# Patient Record
Sex: Male | Born: 1975 | Hispanic: Yes
Health system: Western US, Academic
[De-identification: ages and names within clinical notes are randomized; demographics above are authoritative.]

---

## 2020-03-24 ENCOUNTER — Ambulatory Visit: Payer: Medicaid Other

## 2020-03-24 DIAGNOSIS — R69 Illness, unspecified: Secondary | ICD-10-CM

## 2020-05-09 ENCOUNTER — Ambulatory Visit: Payer: Medicaid Other

## 2020-05-09 DIAGNOSIS — R69 Illness, unspecified: Secondary | ICD-10-CM

## 2020-06-19 ENCOUNTER — Telehealth (HOSPITAL_BASED_OUTPATIENT_CLINIC_OR_DEPARTMENT_OTHER): Payer: Self-pay

## 2020-06-19 NOTE — Telephone Encounter (Signed)
Processed first kidney transplant referral. Plan to forward chart to RN for initial review.

## 2020-06-21 ENCOUNTER — Telehealth (HOSPITAL_BASED_OUTPATIENT_CLINIC_OR_DEPARTMENT_OTHER): Payer: Self-pay

## 2020-06-21 NOTE — Telephone Encounter (Signed)
On Dialysis .Medical records and images requested     Bryan Grant to work on appointment and testing coordination:  Neph appt     [place] to mail packet

## 2020-06-22 ENCOUNTER — Other Ambulatory Visit (HOSPITAL_BASED_OUTPATIENT_CLINIC_OR_DEPARTMENT_OTHER): Payer: Self-pay | Admitting: Internal Medicine

## 2020-06-22 DIAGNOSIS — Z01818 Encounter for other preprocedural examination: Secondary | ICD-10-CM

## 2020-06-22 NOTE — Telephone Encounter (Signed)
Sent FC req to [facility] for [place].

## 2020-06-26 ENCOUNTER — Encounter (HOSPITAL_BASED_OUTPATIENT_CLINIC_OR_DEPARTMENT_OTHER): Payer: Self-pay

## 2020-07-13 ENCOUNTER — Telehealth (HOSPITAL_BASED_OUTPATIENT_CLINIC_OR_DEPARTMENT_OTHER): Payer: Self-pay

## 2020-07-13 NOTE — Telephone Encounter (Signed)
TC with pt and scheduled new pt on 09/08/20. Appt notification letter mailed and emailed to pt. Notified to bring a copy of covid vx card for his first visit. Pt stated he understood and will bring it.

## 2020-07-29 ENCOUNTER — Telehealth (HOSPITAL_BASED_OUTPATIENT_CLINIC_OR_DEPARTMENT_OTHER): Payer: Self-pay

## 2020-07-29 NOTE — Telephone Encounter (Addendum)
CT Abd/Pelvis 05/09/20, CT Chest 03/24/20 [place] are in PACS.

## 2020-08-20 ENCOUNTER — Other Ambulatory Visit (HOSPITAL_COMMUNITY): Payer: Self-pay

## 2020-08-23 ENCOUNTER — Other Ambulatory Visit (HOSPITAL_COMMUNITY): Payer: Self-pay

## 2020-08-23 DIAGNOSIS — R69 Illness, unspecified: Secondary | ICD-10-CM

## 2020-08-24 ENCOUNTER — Encounter (HOSPITAL_BASED_OUTPATIENT_CLINIC_OR_DEPARTMENT_OTHER): Payer: Self-pay

## 2020-09-04 ENCOUNTER — Telehealth (HOSPITAL_BASED_OUTPATIENT_CLINIC_OR_DEPARTMENT_OTHER): Payer: Self-pay

## 2020-09-04 NOTE — Telephone Encounter (Signed)
Left a VM reminder regarding the appointment on Tuesday 09/08/2020.

## 2020-09-08 ENCOUNTER — Encounter (HOSPITAL_BASED_OUTPATIENT_CLINIC_OR_DEPARTMENT_OTHER): Payer: Self-pay

## 2020-09-08 ENCOUNTER — Ambulatory Visit: Payer: No Typology Code available for payment source | Attending: Nephrology | Admitting: Nephrology

## 2020-09-08 ENCOUNTER — Encounter (HOSPITAL_BASED_OUTPATIENT_CLINIC_OR_DEPARTMENT_OTHER): Payer: Self-pay | Admitting: Nurse Practitioner

## 2020-09-08 VITALS — BP 141/87 | HR 87 | Temp 97.2°F | Ht 69.76 in | Wt 181.0 lb

## 2020-09-08 DIAGNOSIS — E118 Type 2 diabetes mellitus with unspecified complications: Secondary | ICD-10-CM | POA: Insufficient documentation

## 2020-09-08 DIAGNOSIS — N186 End stage renal disease: Secondary | ICD-10-CM | POA: Insufficient documentation

## 2020-09-08 DIAGNOSIS — J90 Pleural effusion, not elsewhere classified: Secondary | ICD-10-CM | POA: Insufficient documentation

## 2020-09-08 DIAGNOSIS — T63311D Toxic effect of venom of black widow spider, accidental (unintentional), subsequent encounter: Secondary | ICD-10-CM | POA: Insufficient documentation

## 2020-09-08 DIAGNOSIS — Z992 Dependence on renal dialysis: Secondary | ICD-10-CM | POA: Insufficient documentation

## 2020-09-08 DIAGNOSIS — Z01818 Encounter for other preprocedural examination: Secondary | ICD-10-CM | POA: Insufficient documentation

## 2020-09-08 DIAGNOSIS — I10 Essential (primary) hypertension: Secondary | ICD-10-CM | POA: Insufficient documentation

## 2020-09-08 NOTE — Progress Notes (Signed)
Transplant Nephrology Outpatient Consult - Pretransplant Evaluation Note  Encounter Date: 09/08/2020  ACOMMPANIED BY: This patient is accompanied in the office by his girlfriend.    Dear Dr. Ludwig Clarks,   It was a pleasure to see your patient, Bryan Grant, in the Transplantation Clinic at [place] for evaluation as a potential kidney transplant recipient.      Chief Concern:     Bryan Grant presents to the [facility] for a visit regarding his candidacy for kidney transplantation.      History of Present Illness:    Bryan Grant is a 44 year old man with a history of ESRD in the setting of DM II and history of black widow spider bite.     He recently moved to [place] state in May 2021 from [place]. He reports he is currently listed at [facility] in [place].      He reports being diagnosed with DM in 2005 but otherwise describes himself as being overall healthy working as an Nutritional therapist until 2015.     In 2015, he was bit by a black widow spider. 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 He underwent a few rounds of dialysis while hospitalized but was able to come off of renal replacement. He ultimately started chronic HD in March 2017. He never underwent renal biopsy that he is aware of.     In 2020 he underwent cholecystectomy. Since this surgery he reports an increased gag reflex and seems to carry more of his edema in his stomach rather than his legs. Around the time of his cholecystectomy he also recalls developing a pleural effusion due to unknown etiology.    Since moving to [place] in May 2021 he again developed a pleural effusion and underwent thoracentesis. I do not have records of this at this time although what I can find appears to be related to fluid overload and not infection.     He has established care  with a PCP and nephrology since moving to [place]. He is awaiting appointments with cardiology and dermatology.     Renal history:   Renal biopsy: No  Dialysis start date & Modality: March 2017; HD   Dialysis Regimen: MWF  Problems on dialysis: No, but reports fatigue after HD  Target Weight: 80.1 kg - usually take off ~3 kg  Produces about 2oz of urine/24hrs.  Uremic symptoms: Yes, pruritis   Proteinuria: Unknown  Hematuria: Unknown, no gross hematuria  Kidney stones: No  Recurrent urinary tract infections: No  Urological abnormalities: No  Prostate issues: No    Diabetes: Type 2 diagnosed in 2005 with the following complications:    Retinopathy: Yes, history of injections- now resolved; h/o cataract removal and implants  Neuropathy: Yes, taking cymbalta  Gastropathy: Unknown  Autonomic dysfunction: Sometimes feels cold   Hypoglycemic unawareness: No, fatigue  Hypoglycemic syncopal episodes: No  Nonhealing lower extremity ulcers: No  Amputations: No  Diabetes control: Diet, has been on insulin in the past  Glucose monitoring: not checking  HgbA1c: unknown     Cardiovascular disease:   Coronary artery disease: unknown, history of MI  Congestive Heart Failure: No  Arrhythmia: Yes, history of irregularity with pleural effusion. Reports that this resolved with thoracentesis.   Peripheral vascular disease: No  Cerebrovascular disease: No    Mr. Okazaki endorses ~70lbs  weight in the last year (related to not having a car and walking more per pt report) and good appetite; he follows a renal diet. For exercise he walks and reports that he can walk 10 miles and go up 7 flights of stairs without stopping. Exertion is limited by leg weakness. Denies chest pain or dyspnea with exertion. Reported blood pressures are typically 120s/70s.    Past Medical History  1) ESRD on HD  2) DM II  3) H/o black widow bite (c/b MI and CKD)  4) Cdiff  5) HTN    He reports a history of:   Cancers: No   Skin cancers: No   Seizures: No   Lung  disease: No   Tuberculosis or positive PPD: No   Peptic ulcer disease: No   Hepatitis: No   Gallstones OR Cholecystitis: Yes, see HPI   Prostate problems: No  Parasitic infections: No  Blood transfusions: No  Mental Illness: No  Bleeding Disorders: No    I personally reviewed and confirmed the ROS, past medical history, past surgical history, family history and social history in the record with the patient today.     Review of Systems -   He denies fevers, chills, cough, chest pain, palpitations, shortness of breath, nausea, headache, claudication, edema, bleeding, rashes, joint pain, heartburn, sudden weakness, depression, or anxiety.    Past Surgical History  Fistula creation 2017  Cholecystectomy 2020  Cataract  Astigmatism correction  Cyst removal from back    Trouble with anesthesia: No    Social History  He denies smoking, marijuana, alcohol or illicit drug use. Never smoker. Occasionally has an ETOH drink on New Years Eve  Caregiver: Girlfriend  Married: No  Children: 4  Occupation: Working on Nature conservation officer here  Lives: [place], [place]: No  Travel: No    Family History  Cancer: Paternal uncle died of stomach cancer  Cardiac Disease: Maternal side of family  DM: Mother and fathers side  Renal Disease: No    Mother: No known health issues  Father: Deceased- alzheimers  Siblings: 1 sister; no known health issues    Current Outpatient Medications   Medication Sig Dispense Refill   . 5-Hydroxytryptophan (5-HTP OR) Take by mouth.     Marland Kitchen amLODIPine 10 MG tablet Take 10 mg by mouth daily.     Marland Kitchen atorvastatin 20 MG tablet Take by mouth daily.     . DULoxetine 60 MG DR capsule Take by mouth.     . Glucosamine-Chondroit-Vit C-Mn (GLUCOSAMINE 1500 COMPLEX OR) Take by mouth.     Marland Kitchen KRILL OIL OR Take by mouth.     . lactobacillus rhamnosus GG (Culturelle) capsule Take 1 capsule by mouth.     . Multiple Vitamin (MULTIVITAMIN OR) Take by mouth.     . Nutritional Supplements (DHEA OR) Take by mouth.     . Other Meds (See  Sig/Instructions) daily. Medication name: Ageless Male Supplement     . sevelamer carbonate 800 MG tablet Take 3,200 mg by mouth 3 times a day with meals. 4 with meals and 3 with snacks     . Vitamin D (Cholecalciferol) 50 MCG (2000 UT) capsule Take 6,000 Units by mouth daily.       No current facility-administered medications for this visit.      Medication Notes:   I have reviewed the above list of medicines with the patient and it is accurate.    Review of patient's allergies indicates:  Allergies  Allergen Reactions   . Glipizide Other     Lethargic, Severe Hot and Cold in Extremities.   Celesta Gentile [Sitagliptin] Swelling   . Metformin Other     Lethargic, Severe Hot and Cold in Extremities    . Midodrine Other     Goose Bumps, Feeling Dizzy, Tachycardia, Severe pain when touch       BP (!) 141/87 Comment: post 5 mins  Pulse 87   Temp 36.2 C   Ht 5' 9.76" (1.772 m)   Wt 82.1 kg (181 lb)   SpO2 100% Comment: RA  BMI 26.15 kg/m   Estimated body mass index is 26.15 kg/m as calculated from the following:    Height as of this encounter: 5' 9.76" (1.772 m).    Weight as of this encounter: 82.1 kg (181 lb).     Physical Exam:   Vitals: As above  General: 44 year old man in no acute distress.  HEENT: Moist mucosa without erythema or exudate. Dentition without infection noted.  Neck: Supple, without lymphadenopathy or carotid bruits. No palpable thyroid nodules.   Lungs: Clear to auscultation, without wheeze or rales.  Cardiovascular: Regular rate and rhythm, without murmur, gallop or rub.   Abdominal: Soft, nontender, and nondistended. No hepatosplenomegaly noted.   Extremities: No edema bilaterally. Femoral pulses are 2+. Radial pulses are 2+ and pedal pulses are 2+. There is a upper extremity AV fistula that is intact with a normal bruit and thrill.   Neurologic: Grossly normal. Alert & oriented.  Musculoskeletal: Transfers to and from the exam table with ease.  Skin: Normal without rash. No lower extremity  ulceration.     RESULTS REVIEWED.  Chart reviewed.    Cardiovascular Studies:  Echocardiogram: 03/27/20  There is mild to moderate tricuspid valve regurgitation.   There is mild pulmonic valve regurgitation.   Both atria are severely dilated in size.   The right ventricle is moderately dilated in size with reduced RV systolic   function.   The left ventricle is normal in size with low normal LV systolic function.   There is moderate concentric left ventricular hypertrophy.   There is grade II (moderate) left ventricular diastolic dysfunction.   There are no regional wall motion abnormalities.   Left ventricular ejection fraction is calculated at 52.4%.    Chest X-ray: 05/09/20  There is interval development of a large right pleural effusion, with underlying airspace disease that could represent atelectasis or infiltrate.  There is cardiomegaly.  There is no pneumothorax.    Chest CT: 03/24/20  1. Large right low-density layering pleural effusion with associated compressive atelectasis of the right lung including near complete collapse of the right lower lobe.   2. Additional findings suggestive of volume overload including at least mild ascites as well as diffuse body wall edema.  3. Mild cardiomegaly.    Electrocardiogram: 05/10/20  Sinus rhythm with 1st degree A-V block  Left axis deviation  Left ventricular hypertrophy with QRS widening  T wave abnormality, consider lateral ischemia  Abnormal ECG  When compared with ECG of 24-Mar-2020 09:49,  (RBBB and left anterior fascicular block) is no longer Present    Anatomical Studies:  CT KUB: 05/09/20  1. Large right pleural effusion with adjacent compressive atelectasis.  2. Small to moderate amount of ascites and diffuse anasarca.  3. Question of a nodular contour to the liver. Correlate with LFTs.  4. Mild sigmoid diverticulosis.  5. Cardiomegaly and coronary artery calcifications.    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 His understanding of the transplantation process is fair. The motivation to undergo the transplantation process is appropriate.    We discussed the risks and benefits of kidney transplantation, including surgical and immunosuppression risks, cardiovascular events or even death and [place] wishes to proceed further with the evaluation process. risks. Surgical risks include cardiovascular, peripheral vascular, pulmonary and neurologic complications. Immunosuppression risks include rejection, infection, malignancy, diabetes and complications, weight gain and neurologic complications. Risk of recurrent disease was also discussed.    The benefits of renal transplantation over maintenance dialysis were explained. The patient is aware that receiving a living donor kidney represents the best option in relation to superior transplantation outcomes and allows shorter waiting times. At this time [place] has potential living donors. [place]  was encouraged to have their potential living donor/s contact our living donor program for further information and discussion of candidacy.    [place] case will be discussed by the Transplant team in our weekly transplant meeting and a decision regarding candidacy, further workup and listing will be made at that time.    From a cardiovascular pre-transplant evaluation perspective I recommend further evaluation with cardiac stress test, echocardiogram, chest XR, and EKG.   From a malignancy screening perpective I recommend further evaluation with age appropriate cancer screening.   From an infectious screening and risk evaluation perspective I recommend our routine serological labs and  vaccinations.    Other:  -Obtain report from hospitalization in 2015  -Obtain documentation of listing at [facility] in [place] documentation of pathology from recent hospitalization in May 2021    Thank you for the opportunity to evaluate this very nice patient. Please do not hesitate to contact me with any questions or concerns at [phone] or via my pager [phone].      Warmest Regards,     Ivy Lynn, ARNP  -------------------------  Transplant Nephrology  Department of Medicine  [facility]  [place], [place]  [phone]    This patient was seen in conjunction with Dr. Cletus Gash, Attending Physician, Transplant Nephrology

## 2020-09-08 NOTE — Progress Notes (Signed)
ID eval entered

## 2020-09-10 ENCOUNTER — Telehealth (HOSPITAL_BASED_OUTPATIENT_CLINIC_OR_DEPARTMENT_OTHER): Payer: Self-pay

## 2020-09-10 ENCOUNTER — Encounter (HOSPITAL_BASED_OUTPATIENT_CLINIC_OR_DEPARTMENT_OTHER): Payer: Self-pay

## 2020-09-10 NOTE — Telephone Encounter (Signed)
Spoke to pt discussed the following:  1. He will proceed to eval phase. It sounds like his work-up in [place] is outdated so he agreed to refreshed it at [facility].  2. I warned him [place] might call him for consent in obtaining records from [place]  3. RN education 11/3  4. Pharmacy Class 11/3  5. Dental clearance, need to minor treatment prior to clearance.    [place] to work on appointment and testing coordination. Verify covid1-9 vaccine status prior to scheduling   Cardiac stress   Cardiac ECHO  Abdominal Ultrasound  Vascular arterial duplex: carotid ,iliac and lower extremities   Chest x-ray  Labs    Start requesting auth for in person visit : Surg,RD,SW

## 2020-09-10 NOTE — Committee Review (Signed)
Committee Review Note     Evaluation Date: 09/08/2020  Committee Review Date: 09/10/2020    Organ being evaluated for: Kidney    Transplant Phase: Referral  Transplant Status: Active    Transplant Coordinator: Hulan Saas    Referring Physician: Albertina Senegal    Primary Diagnosis: Diabetes Mellitus - Type II    Committee Review Members:  Jeff Davis, RD   Financial Coordinator Deforest Hoyles, PSS/PSR   Nephrology Michaell Cowing, ARNP, Elizabeth Sauer, MD, Kathlene November, ARNP, Winnifred Friar, MD, Lucas Mallow, MD   Program Coordinator Danton Sewer, Coordinator, Shiela Mayer, CMA, Bartolo Darter, Coordinator, Ashley Akin, Coordinator   Psychiatry Eddie North, MD   Social Work  Lupita Dawn, MSW, Thereasa Distance, LICSW   Transplant Coordinator Jonette Eva, RN, Laymond Purser, RN, Henrine Screws, RN   Transplant Surgery Anastasia Fiedler, MD, Sela Hua, MD, Doristine Devoid, MD       Transplant Eligibility: End stage renal disease on dialysis    Committee Review Decision: Needs Re-presentation    Relative Contraindications: None    Absolute Contraindications: None    Committee Discussion Details:   Proceed with work-up, get records from [facility]  In [place] and [place] widow bite. Per pet he is listed in [place].

## 2020-09-11 ENCOUNTER — Telehealth (HOSPITAL_BASED_OUTPATIENT_CLINIC_OR_DEPARTMENT_OTHER): Payer: Self-pay

## 2020-09-11 NOTE — Telephone Encounter (Signed)
Left a VM reminder regarding the phone visit on Wednesday 09/16/2020.

## 2020-09-14 NOTE — Progress Notes (Signed)
I have seen and evaluated [facility] with Ms. Brewer and have performed or reviewed the history, review of systems, physical exam as documented in her note.     44 yo male with PMH ESRD secondary to DM cx by AKI secondary to spider bite resulting in sepsis, here for KT evaluation.  Pt reported wait-listed in [place].  We will likely need to obtain his records prior to proceeding with evaluation.    The benefits of kidney transplant were explained in detail, and living kidney donation was discussed.  The risks of surgery and immunosuppression were explained in detail. These include surgical complications, infections, cardiovascular events and even death and [place] wishes to proceed further with the evaluation.     [place] case will be discussed by the transplantation team in our weekly meetings and a decision regarding listing and further workup will be made at that time. Overall I will recommend him as an acceptable candidate for renal transplantation.     This represents a shared visit with Ms. Doug Sou, ARNP, required for the Transplant Nephrology expertise.       Lucas Mallow, MD  Departments of Medicine  Division of Nephrology & Transplantation  [place] [facility]

## 2020-09-16 ENCOUNTER — Ambulatory Visit: Payer: Medicare HMO

## 2020-09-16 ENCOUNTER — Ambulatory Visit (HOSPITAL_BASED_OUTPATIENT_CLINIC_OR_DEPARTMENT_OTHER): Payer: Self-pay | Admitting: Unknown Physician Specialty

## 2020-09-16 DIAGNOSIS — Z01818 Encounter for other preprocedural examination: Secondary | ICD-10-CM

## 2020-09-16 NOTE — Progress Notes (Signed)
Today, I spent 60 minutes discussing Kidney transplantation with [facility] . This education session was conducted via phone due to COVID-19 health crisis.     I discussed the transplant work-up process within the guidelines set forth by the [place] [facility].    I discussed telephone contacts including Transplant Coordinator office telephone numbers, fax numbers.     The Transplant Evaluation process was discussed in detail. The discussion included the purpose of the evaluation. What medical tests could be expected at the [facility] including blood draw for the following serologic testing: HIV, Hepatitis A, B, C, Syphillis, and TB. What testing would need to be completed through their primary care physician's office.    I discussed what patients could anticipate as we evaluate their transplant candidacy. I also discussed that additional testing may be required at [facility] which would entail additional visits to [facility].    I emphasized and discussed the benefits from a living donor.    I discussed the [place] [facility] [place]'s policy of no tolerance use of illicit drugs and alcohol. Random screening for nicotine and cotinine was discussed; the patient consented to random screening today at this initial visit.     I discussed what happens when all testing is completed including the selection committee review process for all transplant candidates. A copy of the [facility] Selection Criteria for Kidney Transplantation was reviewed.    I discussed the 'The Patient Acknowledgement for Kidney Transplant' with the patient. The discussion included purpose, process, testing, risks and benefits of Kidney transplant.     I discussed the [facility] Multiple Listing and Waiting Time Transfer with the patient.      This information was provided by:     Hulan Saas, RN.  Kidney/Pancreas Transplant Coordinator  [facility]

## 2020-09-16 NOTE — Progress Notes (Signed)
The patient Bryan Grant and care giver(s) attended the Pre-Transplant Class, including the Medication Education session, as part ot the pre-transplant evaluation and education process.    Discussion Items:    Preparing for transplant:   I discussed pre-transplant preparation issues such as obtaining equipment for monitoring vital signs post-transplant.   I recommended to read medication section of the Guide to Your Transplant education manual.    Financial planning for medications:   I discussed insurance and financial issues regarding paying for medications. I emphasized that patients should understand their prescription benefits and be financially prepared to pay for medications.   I discussed that if a patient has multiple insurances that not all pharmacies are able to coordinate benefits between insurance carriers, and that it is important they discuss this with their pharmacy, or may necessitate changing to a pharmacy that is able.   I discussed the issue of copays and deductables and their various stuctures (i.e. flat rate vs percentage), and that understanding this is paramount to calculating out of pocket costs.   I discussed that if anyone is eligible for military prescription benefits that they sign up for this prior to transplant to assist with coverage of medications.   I discussed that in order to understand pharmacy benefit information, the patient may contact their insurance case worker, speak to their community pharmacist, or contact the transplant financial services.   I discussed that it is important that the patient stay current on what their prescription benefits are and any plan changes. In the event that the patient looses prescription benefits before transplant, they should contact the transplant program. Also, I explained that post-transplant, if they should loose prescription coverage or have difficulties paying for medications that they contact the transplant  service.   I also discussed the availability of medication assistance resource websites, manufacturer patient assistance programs.   I discussed implications of Medicare part B and D on paying for transplant medications.    Transplant medication education in the hospital:   I discussed the education process post-transplant including the provision of one-on-one teaching by the transplant pharmacist, provision of a medication list and mediset, and the taking of self-administered medications while in the hospital.   I explained that during the medication education, a family member or care-giver must be present for the medication teaching.    Pharmacy planning:   I discussed the discharge process including the provision of discharge prescriptions and the need to have a pharmacy plan for filling them.   I discussed that it is important to find out if their prescription insurance coverage requires that they use any specific pharmacies or mail order pharmacy. I explained that we prefer for the first month or two to use a pharmacy locally because of the rapid turn around time required but that eventually can transition to mail order or other preferred pharmacy.   I explained that not all pharmacies are contracted with all insurance carriers including [facility] pharmacy. The inpatient pharmacist will work with them to ensure the outside filling pharmacy can process prescriptions through the patient s insurance plan. If the patient lives outside of the [place] area and is staying locally, I explained that we will help you to find a pharmacy convenient to your location while you are here.   I explained that if the patient expects to fill medications through the [employer] (i.e. [facility]), that they will need a provider within the [place] system to write the orders to be filled because  outside provider orders are not accepted by them.    Transplant success:   I discussed general prescription-filling practices that will ensure  success, such as: planning ahead for refills, filling at the same pharmacy, avoiding natural and herbal medications, alerting other health care providers of current medications, asking for advice on OTC medications to avoid drug interactions   I discussed need to carry a current list of your medications and using your electronic devices to help you maintain a list.   I emphasized that after transplant, it is important that you always have access to your medications, particularly your immunosuppressive drugs, and that if you travel you should carry an adequate supply with you and also enough in case of emergency (i.e. if you go away for a weekend, bring with you a week's worth of medication).   I discussed the obligations of the transplant recipient (such as knowing the side effects of your immunosuppressants) and medication-related follow-up required in the post-transplant follow-up period.    General medication education:   I discussed the meaning, need, and importance, and life-long commitment for taking immunosuppressive drugs post-transplant. I specifically discussed the function, dosing, length of therapy, cost, and side effects of the following immunosuppressive drugs: Simulect, Thymoglobulin, Prograf, Myfortic, prednisone.   I discussed the possibility of infection in the first 3-6 months and the life-long increased susceptibility to infection. I specifically discussed the function, dosing, length of therapy, cost, and side effects of the following antibiotic, antifungal, antiviral drugs: clotrimazole, valganciclovir/acyclovir, trimethoprim/sulfamethoxazole.   I discussed the function, dosing, length of therapy, cost, and side effects of ranitidine, docusate, multivitamin, calcium, vitamin D, magnesium, ursodiol.    When you are called for transplant:   I explained when you are called in for transplant, to bring your patient manual (pre-transplant manual), all equipment for home drug monitoring (blood  pressure cuff, blood glucose monitors, etc), a list of your medications doses and frequencies, all your medication bottles and pharmacy insurance cards.    Resources provided for medication education:  The patient was given the following handout entitled [facility] - Transplant Out-Patient Medication Costs and a copy of the powerpoint presentation of this class.

## 2020-10-06 NOTE — Telephone Encounter (Signed)
Uris, please start pending/ scheduling request from [place]

## 2020-11-02 ENCOUNTER — Emergency Department: Payer: Self-pay

## 2020-11-09 NOTE — Telephone Encounter (Signed)
Called pt to ask testing questions in order to pend orders. Left a VM for pt to CB.

## 2020-11-10 ENCOUNTER — Other Ambulatory Visit (HOSPITAL_BASED_OUTPATIENT_CLINIC_OR_DEPARTMENT_OTHER): Payer: Self-pay

## 2020-11-10 DIAGNOSIS — N186 End stage renal disease: Secondary | ICD-10-CM

## 2020-11-19 NOTE — Telephone Encounter (Addendum)
Called pt to schedule some testing. Echo, Duplex, [place] ABD, CXR and labs are scheduled on Monday, Jan 24.  Still waiting for auth for Stress. Once stress is authorized, will call pt for scheduling. Appointment reminder letter is mailed/emailed to pt's address on file.    Routing to RN to pend lab orders

## 2020-11-21 ENCOUNTER — Emergency Department: Payer: Self-pay

## 2020-11-21 ENCOUNTER — Inpatient Hospital Stay: Payer: Self-pay

## 2020-11-21 ENCOUNTER — Other Ambulatory Visit (HOSPITAL_BASED_OUTPATIENT_CLINIC_OR_DEPARTMENT_OTHER): Payer: Self-pay

## 2020-11-21 DIAGNOSIS — N186 End stage renal disease: Secondary | ICD-10-CM

## 2020-11-25 NOTE — Telephone Encounter (Signed)
Sarah, please follow-up on status of stress test.

## 2020-11-26 NOTE — Telephone Encounter (Signed)
Sent message to [facility] to check status of auth.

## 2020-12-07 ENCOUNTER — Ambulatory Visit (HOSPITAL_BASED_OUTPATIENT_CLINIC_OR_DEPARTMENT_OTHER)
Admit: 2020-12-07 | Discharge: 2020-12-07 | Disposition: A | Discharge status: Home / Self Care | Payer: No Typology Code available for payment source | Source: Home / Self Care

## 2020-12-07 ENCOUNTER — Ambulatory Visit
Admission: RE | Admit: 2020-12-07 | Discharge: 2020-12-07 | Disposition: A | Discharge status: Home / Self Care | Payer: No Typology Code available for payment source | Attending: Cardiovascular Disease | Admitting: Cardiovascular Disease

## 2020-12-07 DIAGNOSIS — Z01818 Encounter for other preprocedural examination: Secondary | ICD-10-CM

## 2020-12-07 DIAGNOSIS — N186 End stage renal disease: Secondary | ICD-10-CM

## 2020-12-07 LAB — TRANSTHORACIC ECHO (TTE) COMPLETE
AoV max: 127.2 cm/s
Ascending aorta: 3 cm
E/E' ratio: 10.5
IVSd: 0.93 cm
LV Systolic Volume (BP): 110 ml
LVIDd: 5.4 cm
LVIDs: 4.7 cm
LVPWd: 1.4 cm
RV FAC 2D: 14.2
RV Free wall pk S': 49 mmHg
TR Peak Vel: 269.1 cm/s

## 2020-12-10 ENCOUNTER — Other Ambulatory Visit (HOSPITAL_COMMUNITY): Payer: No Typology Code available for payment source

## 2020-12-22 ENCOUNTER — Emergency Department: Payer: Self-pay

## 2020-12-23 NOTE — Telephone Encounter (Signed)
Stress test now authorized until February 25, 2021.     First attempt made, called patient cell phone, no answer and VM box full. Attempted to call house phone, number was invalid. Unable to leave message to schedule testing and team visits.     Will wait for call back and reach out again if patient does not respond.

## 2020-12-25 ENCOUNTER — Emergency Department: Payer: Self-pay

## 2020-12-26 ENCOUNTER — Inpatient Hospital Stay: Payer: Self-pay

## 2020-12-29 ENCOUNTER — Telehealth (HOSPITAL_BASED_OUTPATIENT_CLINIC_OR_DEPARTMENT_OTHER): Payer: Self-pay

## 2020-12-29 NOTE — Telephone Encounter (Signed)
Chart reviewed: multiple admissions and ED visits since Dec 2021  2/11- 2/14 , was at [facility] due to dyspnea and difficulty walking, K+ was at 6.2  2/8 - 2/9 [facility] ED for progressively worsening shortness of breath and chest pain. + Covid , Hydrothorax-s/p thoracentesis removed 4L  1/8-/11 [facility] for SOB possible reaction with lyrica with dizziness and trouble walking,underwent right pleural effusion for 4.1L,cultures and cytology were negative, exudative  10/2020 [facility] for SOB, s/p thoracentesis removed 2L  Most recent echo showed EF 32%  Pt has been referred to cardiology and pulmonary for further w/up but pt has not acted on this local referral    FYI to [place]

## 2020-12-31 ENCOUNTER — Telehealth (HOSPITAL_BASED_OUTPATIENT_CLINIC_OR_DEPARTMENT_OTHER): Payer: Self-pay

## 2020-12-31 ENCOUNTER — Encounter (HOSPITAL_BASED_OUTPATIENT_CLINIC_OR_DEPARTMENT_OTHER): Payer: Self-pay

## 2020-12-31 NOTE — Committee Review (Signed)
Committee Review Note     Evaluation Date: 09/10/2020  Committee Review Date: 12/31/2020    Organ being evaluated for: Kidney    Transplant Phase: Evaluation  Transplant Status: Active    Transplant Coordinator: Hulan Saas    Referring Physician: Albertina Senegal    Primary Diagnosis: Diabetes Mellitus - Type II    Committee Review Members:  Dietitian/Nutritionist Dorita Sciara, RD   Financial Coordinator Shirley Friar, Kuna, PSS/PSR   Nephrology Michaell Cowing, ARNP, Elizabeth Sauer, MD, Fanny Skates, ARNP, Kathlene November, ARNP, Winnifred Friar, MD, Lucas Mallow, MD   Program Coordinator Bartolo Darter, Coordinator, Metro Kung, Coordinator   Psychiatry Eddie North, MD   Social Work  Lupita Dawn, LICSW   Surgery Anastasia Fiedler, MD   Transplant Coordinator Jonette Eva, RN, Laymond Purser, RN, Henrine Screws, RN   Transplant Surgery Sela Hua, MD, Doristine Devoid, MD       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  Significant pulmonary disease    Absolute Contraindications: None    Committee Discussion Details:  Multiple ED and Admissions for various reasons since Dec 2021  - weakness, concerns of frailty  - Dyspnea requiring multiple thoracentesis, removing 2-4L each time   - missing multiple dialysis treatment, when not feeling well he doesn't go,eventually will show up in ED volume overload or hyperkalemic   - EF 32% with regional wall abnormalities   Lesly Rubenstein spoke to Dr Ludwig Clarks who agree on denial, pt not stable at this time  Plan: deny, can be referred once - cleared by cardiology and pulmonary  I attest to the committee's decision for this patient.  12/31/2020 Lucas Mallow, MD

## 2020-12-31 NOTE — Telephone Encounter (Signed)
Called pt after SC, LM regarding case, instructed to follow-up with writer  Denial notification sent to pt,[facility] and [facility]

## 2021-01-05 ENCOUNTER — Emergency Department: Payer: Self-pay

## 2021-01-11 ENCOUNTER — Inpatient Hospital Stay: Payer: Self-pay

## 2021-01-11 ENCOUNTER — Emergency Department: Payer: Self-pay

## 2021-01-22 ENCOUNTER — Emergency Department: Payer: Self-pay

## 2021-01-22 ENCOUNTER — Inpatient Hospital Stay: Payer: Self-pay

## 2021-03-06 ENCOUNTER — Emergency Department: Payer: Self-pay

## 2021-03-06 ENCOUNTER — Inpatient Hospital Stay: Payer: Self-pay

## 2021-05-03 ENCOUNTER — Inpatient Hospital Stay: Payer: Self-pay

## 2021-05-03 ENCOUNTER — Emergency Department: Payer: Self-pay

## 2021-09-08 IMAGING — US US RUQ
1 series · 14 of 25 positions shown · non-contrast
Comparison: CT chest 02/06/2020.

HISTORY: 43-year-old male with elevated alkaline phosphatase levels.
TECHNIQUE: Using real-time and Doppler ultrasound, the right upper quadrant of the abdomen was evaluated.

[Series 1: us ruq · 14 of 46 slices shown]
[im 1/46]
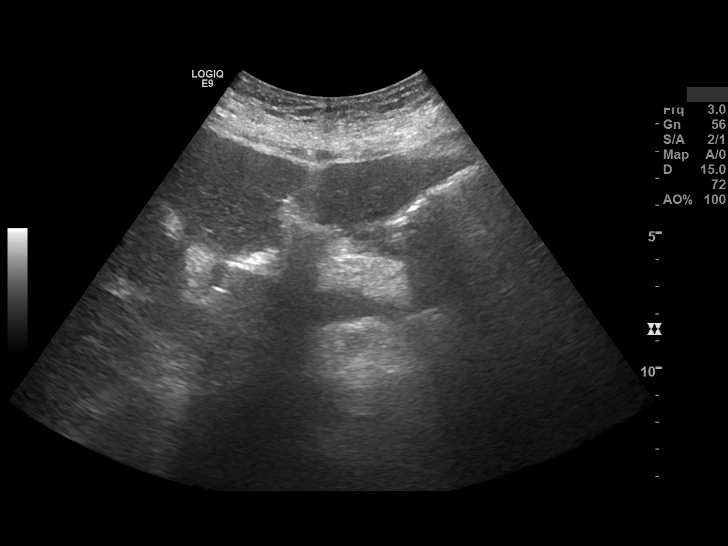
[im 4/46]
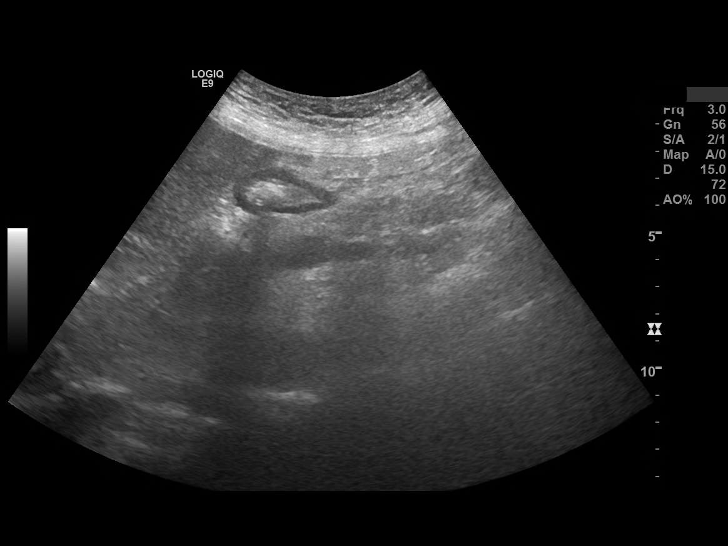
[im 8/46]
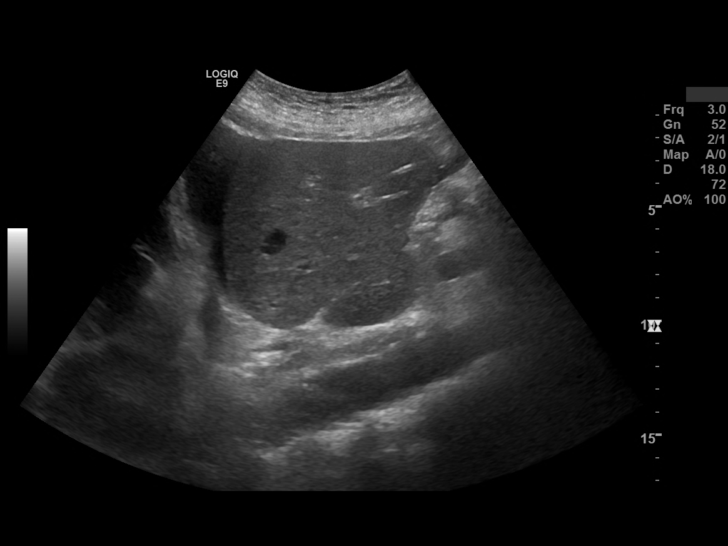
[im 12/46]
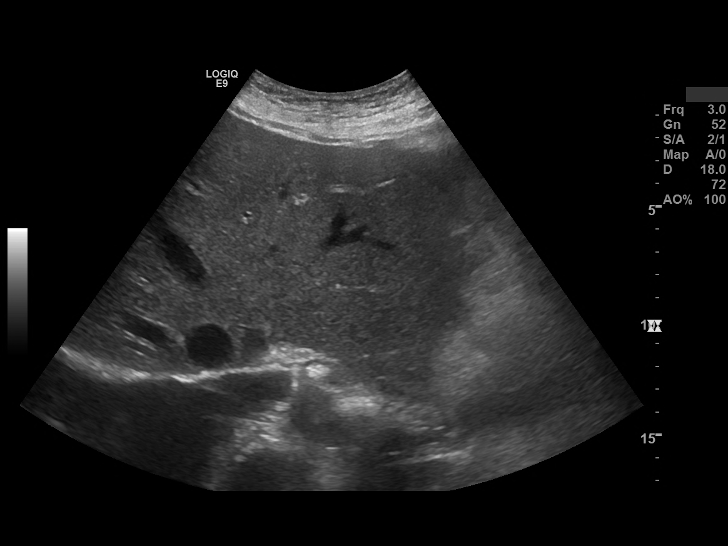
[im 16/46]
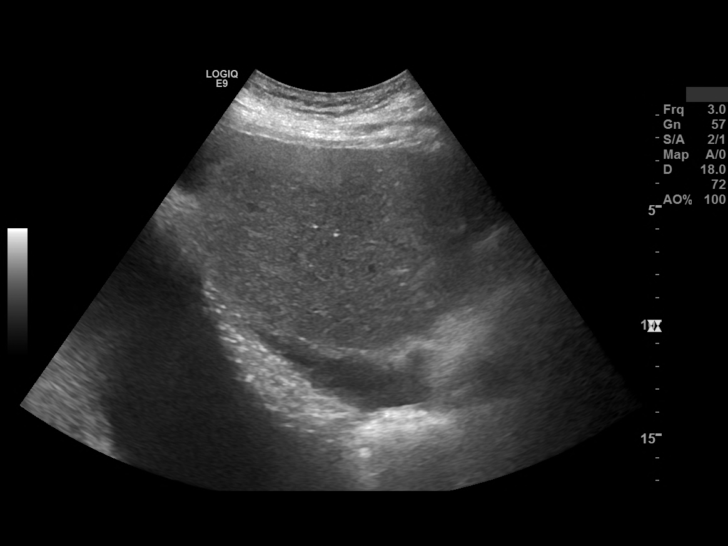
[im 17/46]
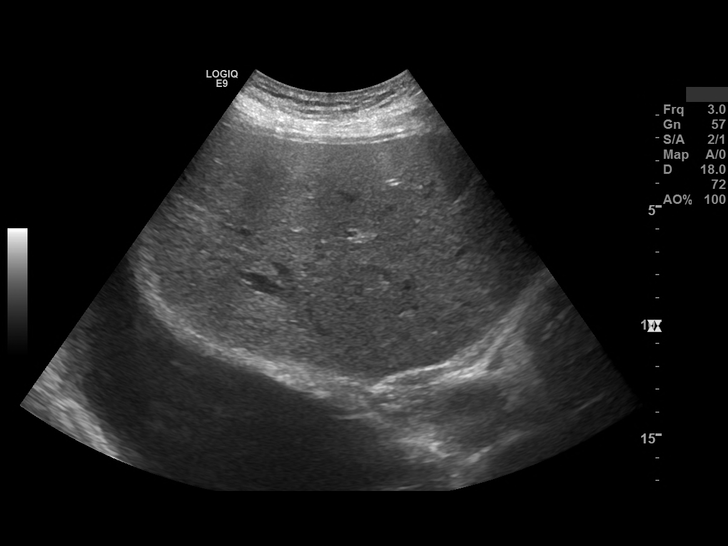
[im 21/46]
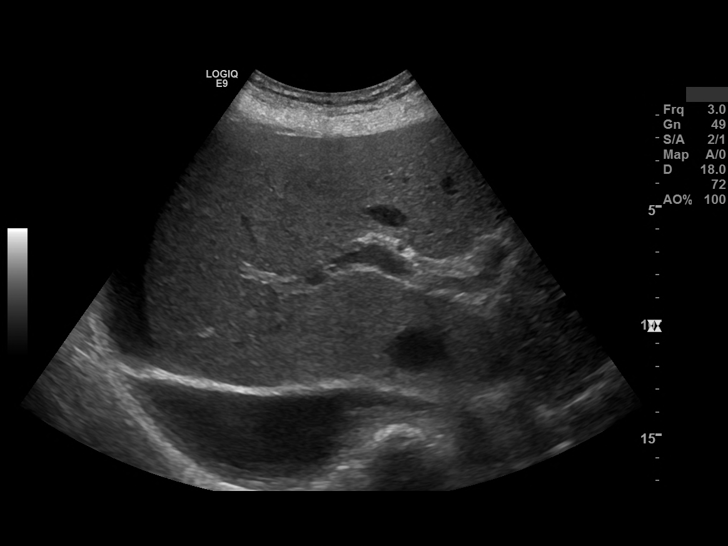
[im 25/46]
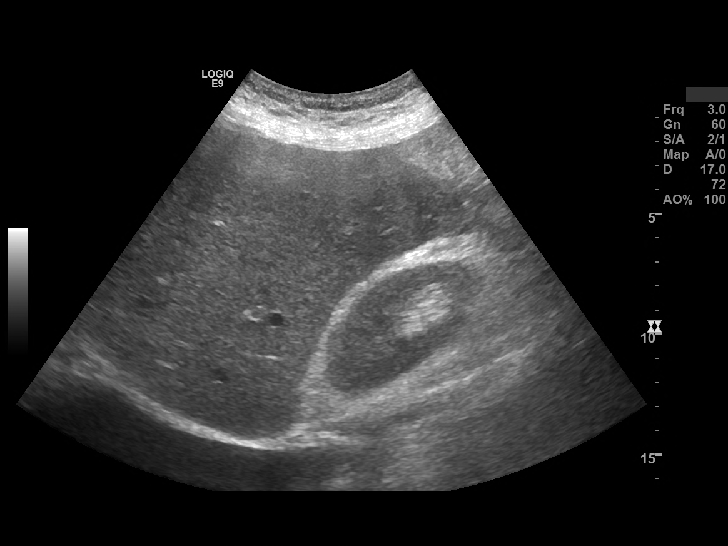
[im 29/46]
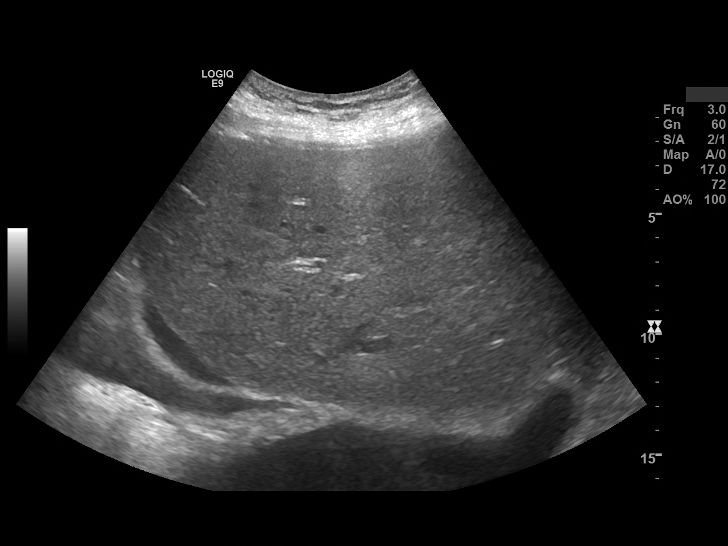
[im 31/46]
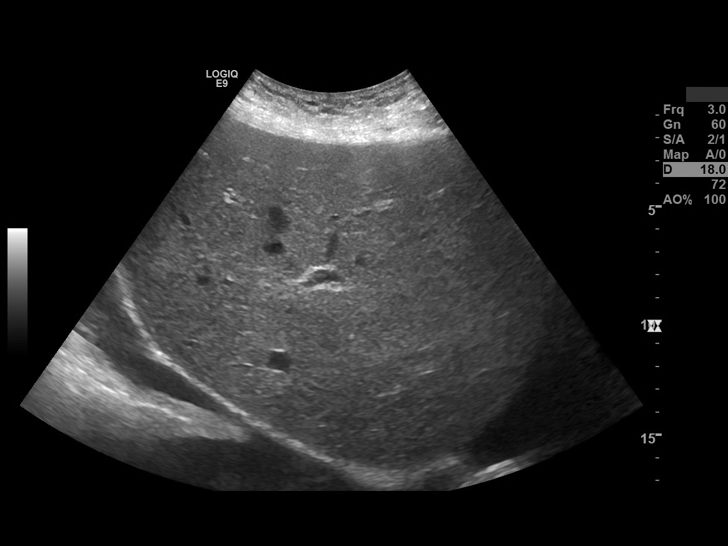
[im 34/46]
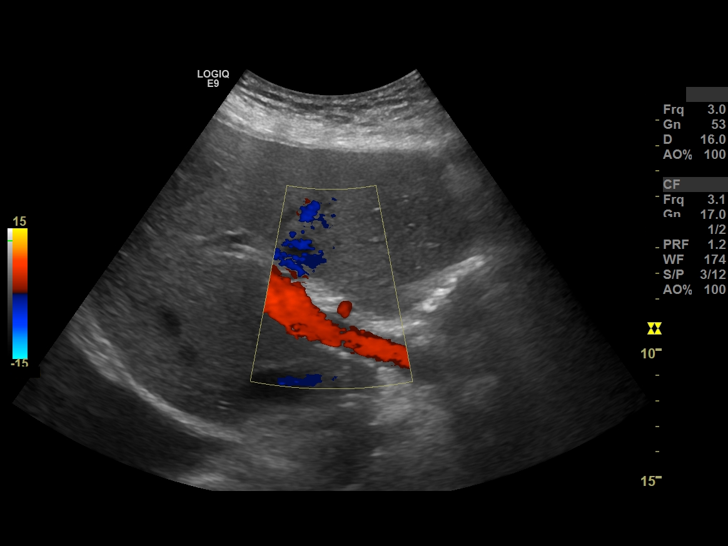
[im 38/46]
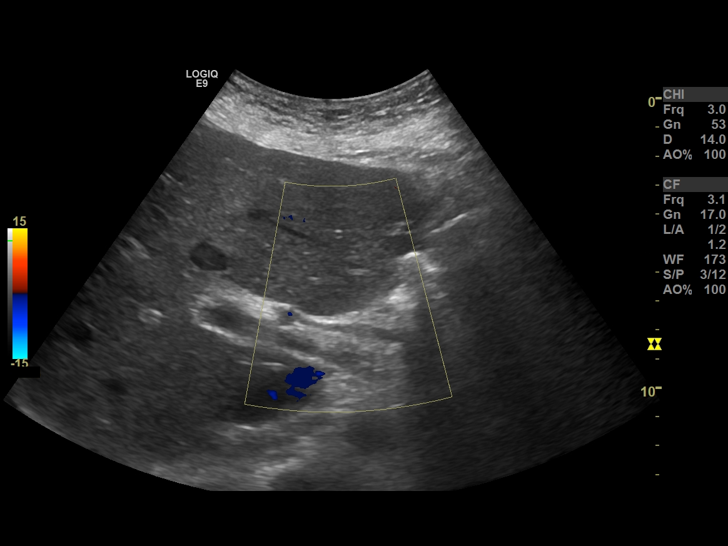
[im 42/46]
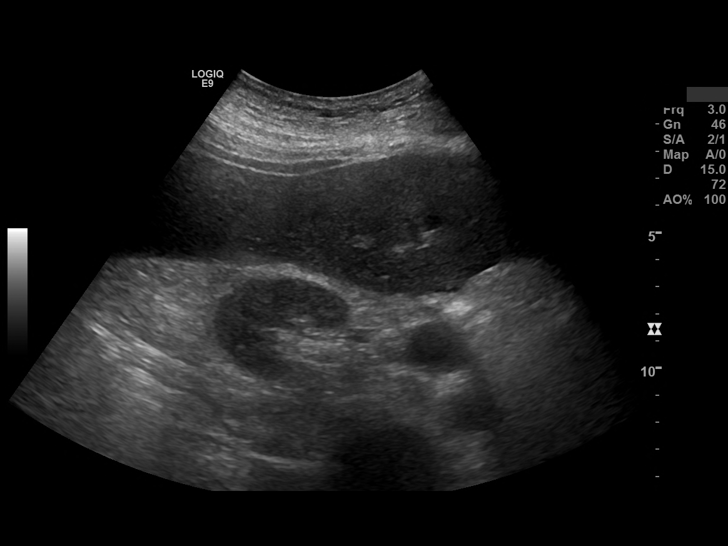
[im 46/46]
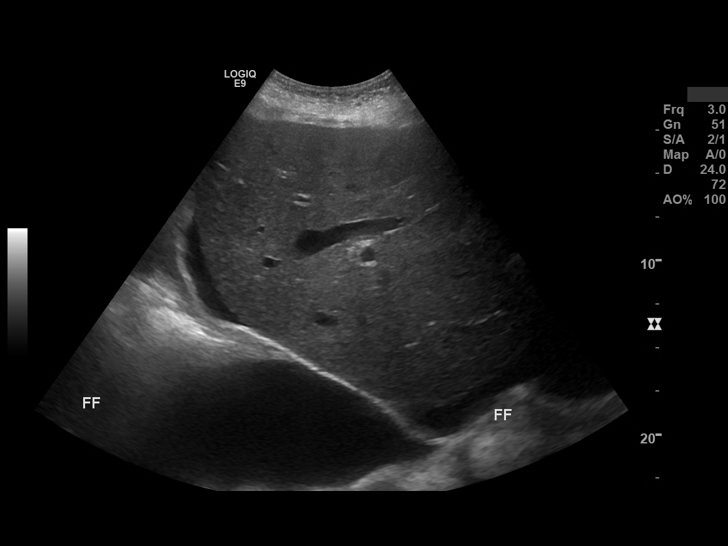

[14 of 25 positions shown; findings below may reference images not displayed]

FINDINGS: The visualized portion of the pancreas is unremarkable. The visualized portion of the abdominal aorta and inferior vena cava is unremarkable.

Liver: Length of 178 mm in the right midclavicular line. The liver echotexture is within normal limits. Hepatopetal flow is identified in the portal vein. No focal masses identified. No intrahepatic biliary ductal dilatation is seen. 

Gallbladder: Surgically absent. The common bile duct measures 5.40 mm.

Right kidney measures 97 x 36 x 39 mm.

Ascites. Partly imaged right pleural effusion.
IMPRESSION: No intrahepatic or extrahepatic bile duct dilation. Cholecystectomy.

Borderline hepatomegaly.

Ascites and partly imaged right pleural effusion.

## 2021-09-08 IMAGING — CT CT THORAX W CONTRAST
2 of 5 series · 11 of 36 positions shown, 13 images · IV contrast (agent unspecified)
Comparison: Report of outside chest radiograph 01/28/2020

HISTORY: 43-year-old Male with lower lung volume loss, pneumonia. Abnormal chest radiograph.
TECHNIQUE: CT chest study was performed with IV contrast using axial images. 80 mL Jsovue-HBB intravenous contrast was administered. Sagittal and coronal reconstructed images were obtained.

[Series 4: coronal · coronal · 0.74mm/px · 3 of 51 slices shown]
[im 11/51  lung]
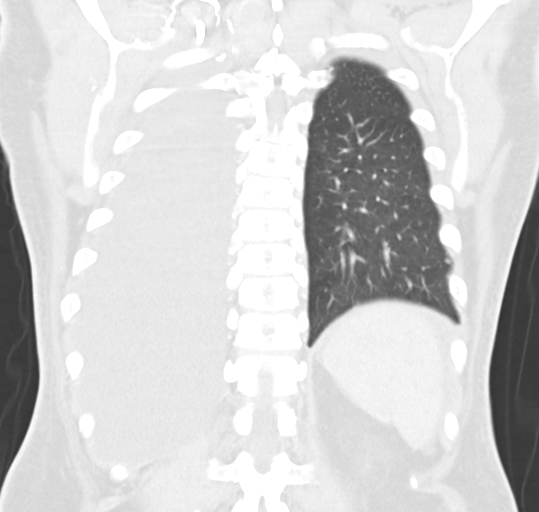
[im 21/51  lung]
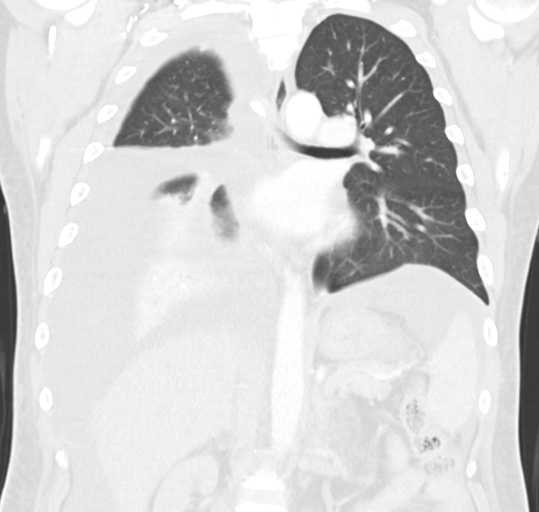
[im 31/51  lung]
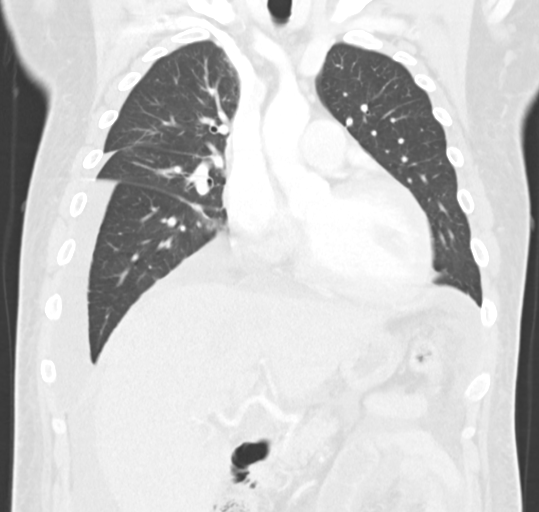

[Series 8: lung · axial · 0.55mm/px · z∈[-1228,-968]mm · 8 of 159 slices shown, 10 images]
[im 19/159  mediastinal]
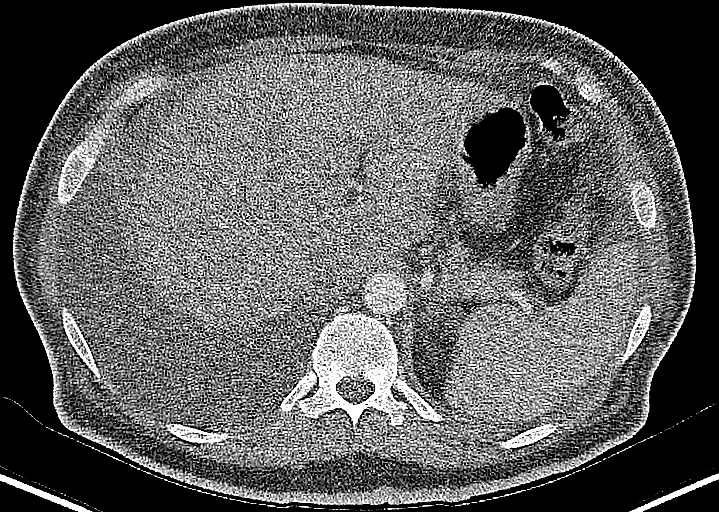
[im 19/159  lung]
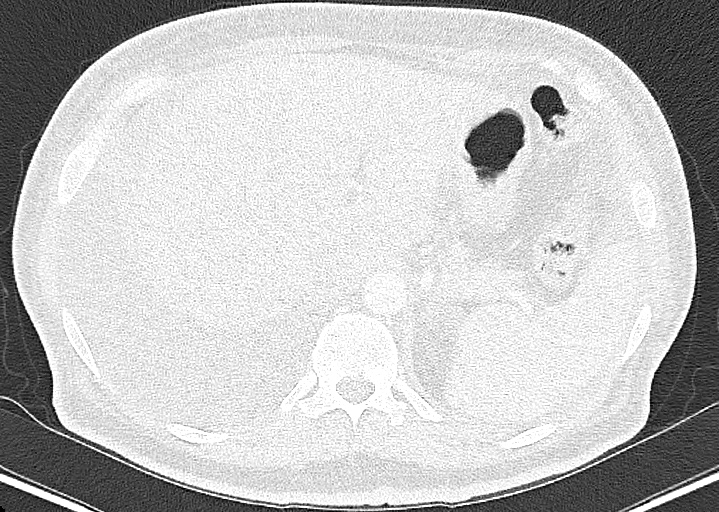
[im 38/159  lung]
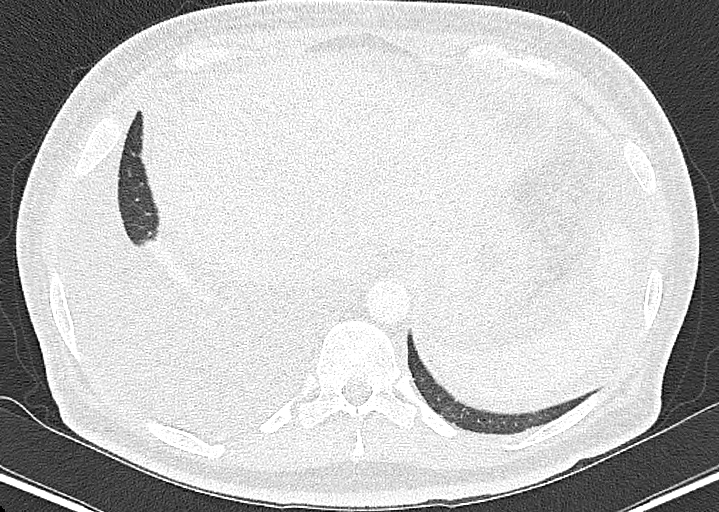
[im 56/159  lung]
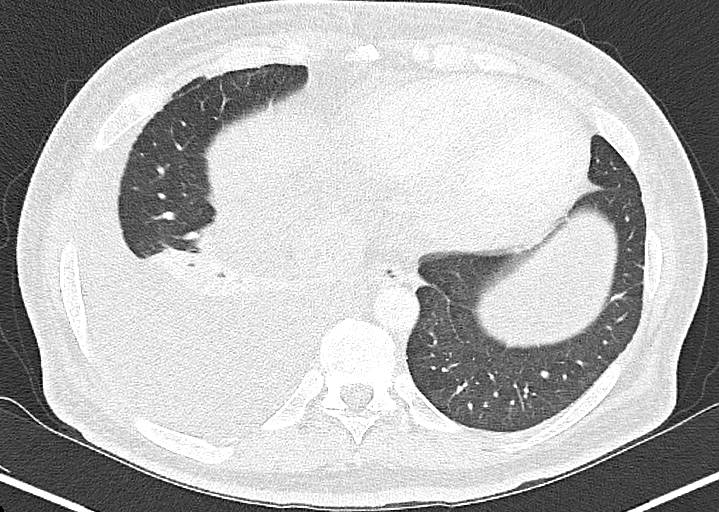
[im 75/159  lung]
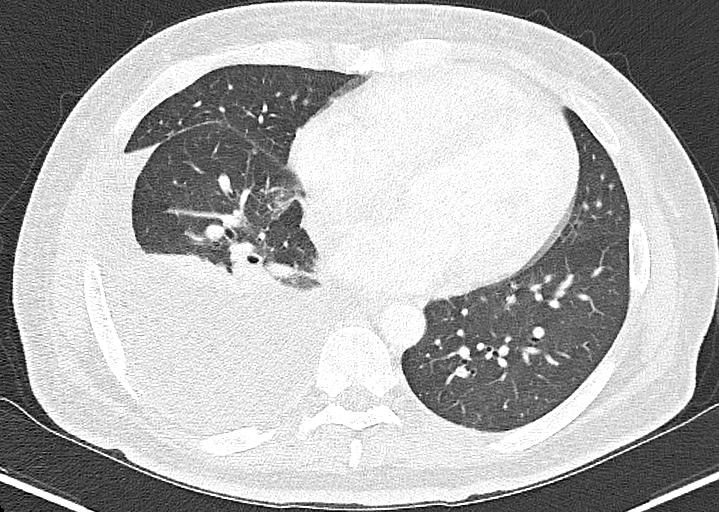
[im 93/159  mediastinal]
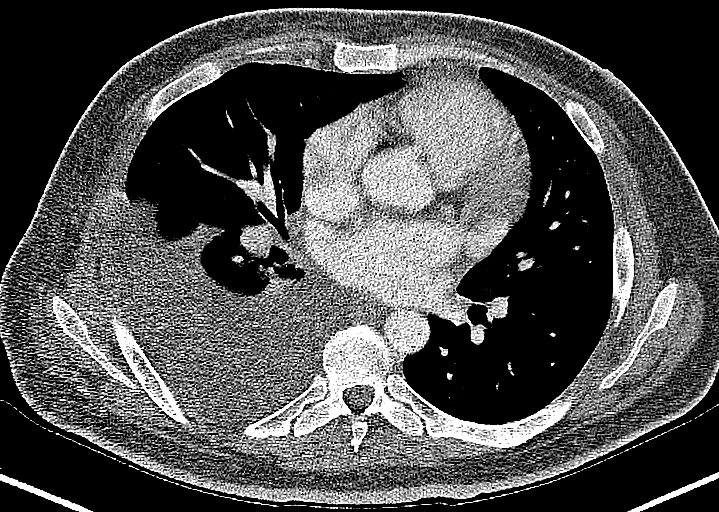
[im 93/159  lung]
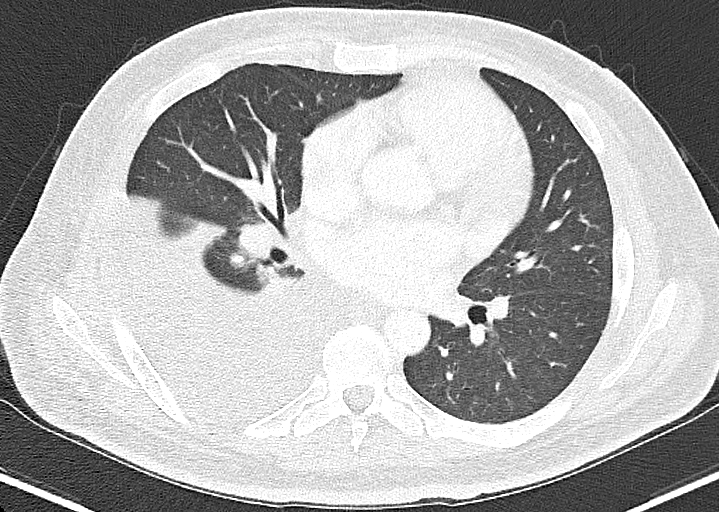
[im 112/159  lung]
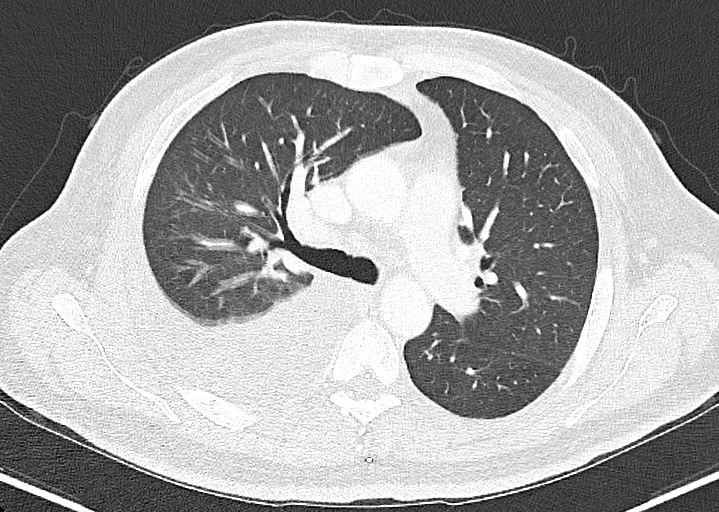
[im 131/159  lung]
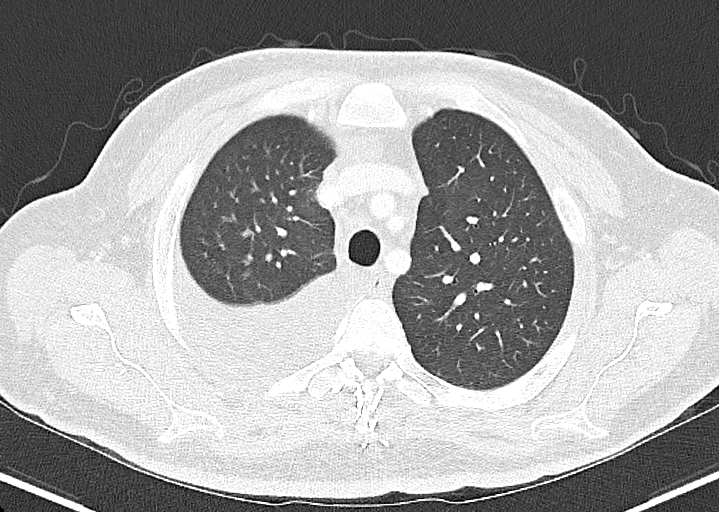
[im 149/159  lung]
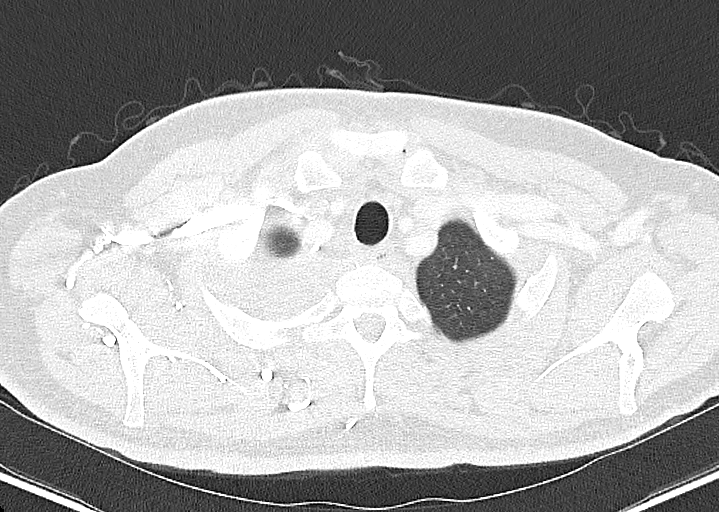

[11 of 36 positions shown; findings below may reference images not displayed]

FINDINGS: Visualized lower neck: The thyroid is unremarkable. No suspicious lesion.

Axilla: No lymphadenopathy.

Mediastinum/hila: No lymphadenopathy.

Cardiovascular: Mild cardiomegaly. No significant pericardial effusion.

Upper abdomen: Moderate ascites. Cholecystectomy. No bile duct dilation. Low-attenuation left adrenal nodule measures 18 x 18 mm.

Osseous structures: No suspicious lesion or acute fracture.

Pleura: Large right pleural effusion with adjacent atelectasis. No pneumothorax.

Lungs: Few micronodules in both lungs, likely partly calcified and of doubtful clinical significance. No suspicious pulmonary nodule, mass or airspace consolidation. Partial right lower lobe atelectasis secondary to large right pleural effusion.
IMPRESSION: Large right pleural effusion with adjacent right lower lobe atelectasis. No airspace consolidation.

Moderate ascites.

Low-attenuation 18 mm left adrenal nodule, likely benign. This may be further evaluated with CT abdomen (adrenal protocol). If there is no history of malignancy, this can be obtained at 12 months. If there is history of malignancy, then evaluation is recommended sooner.

Total radiation dose to patient is CTDIvol 7.40 mGy and DLP 301.74 mGy-cm.

## 2022-02-03 ENCOUNTER — Telehealth (HOSPITAL_BASED_OUTPATIENT_CLINIC_OR_DEPARTMENT_OTHER): Payer: Self-pay

## 2022-02-03 NOTE — Telephone Encounter (Signed)
Spoke to Angie at [facility] and explained that per RN review, we cannot patient's re-referral until he has Cardiac & Pulmonary clearance. He also needs to have a recent Echo with an EF closer to 50%. She understood.

## 2022-04-19 LAB — COMPREHENSIVE METABOLIC PANEL
ALT (SGPT): 30 U/L (ref 7–52)
AST (SGOT), External: 28 U/L (ref 13–39)
Albumin, External: 4.3 g/dL (ref 3.5–5.7)
Alkaline Phosphatase, Phosphate: 475 U/L — ABNORMAL HIGH (ref 34–104)
Anion Gap, External: 11 mmol/L (ref 5–16)
BUN: 44 mg/dL — ABNORMAL HIGH (ref 7.0–25.0)
Bilirubin Total, External: 0.6 mg/dL (ref 0.3–1.0)
CO2, External: 31 mmol/L (ref 21–31)
Calcium, External: 9.7 mg/dL (ref 8.6–10.3)
Chloride, External: 93 mmol/L — ABNORMAL LOW (ref 98–107)
Creatinine, External: 7.06 mg/dL — ABNORMAL HIGH (ref 0.70–1.30)
Glucose, External: 200 mg/dL — ABNORMAL HIGH (ref 70–109)
Potassium, External: 4.9 mmol/L (ref 3.5–5.1)
Sodium, External: 135 mmol/L — ABNORMAL LOW (ref 136–145)
Total Protein, External: 8.5 g/dL — ABNORMAL HIGH (ref 6.0–8.3)
eGFR, External: 9 mL/min/{1.73_m2} — ABNORMAL LOW (ref >60)

## 2022-05-25 LAB — BASIC METABOLIC PANEL
Anion Gap, External: 15 mmol/L (ref 5–16)
BUN: 91 mg/dL — ABNORMAL HIGH (ref 7.0–25.0)
CO2, External: 26 mmol/L (ref 21–31)
Calcium, External: 9.2 mg/dL (ref 8.6–10.3)
Chloride, External: 91 mmol/L — ABNORMAL LOW (ref 98–107)
Creatinine, External: 10.85 mg/dL — ABNORMAL HIGH (ref 0.70–1.30)
Glucose, External: 155 mg/dL — ABNORMAL HIGH (ref 70–109)
Potassium, External: 6 mmol/L — ABNORMAL HIGH (ref 3.5–5.1)
Sodium, External: 132 mmol/L — ABNORMAL LOW (ref 136–145)
eGFR, External: 5 mL/min/{1.73_m2} — ABNORMAL LOW (ref >60)

## 2022-05-26 LAB — BASIC METABOLIC PANEL
Anion Gap, External: 15 mmol/L (ref 5–16)
BUN: 97 mg/dL — ABNORMAL HIGH (ref 7.0–25.0)
CO2, External: 26 mmol/L (ref 21–31)
Calcium, External: 8.4 mg/dL — ABNORMAL LOW (ref 8.6–10.3)
Chloride, External: 92 mmol/L — ABNORMAL LOW (ref 98–107)
Creatinine, External: 12.16 mg/dL — ABNORMAL HIGH (ref 0.70–1.30)
Glucose, External: 113 mg/dL — ABNORMAL HIGH (ref 70–109)
Potassium, External: 5.4 mmol/L — ABNORMAL HIGH (ref 3.5–5.1)
Sodium, External: 133 mmol/L — ABNORMAL LOW (ref 136–145)
eGFR, External: 5 mL/min/{1.73_m2} — ABNORMAL LOW (ref >60)

## 2022-05-26 LAB — OTHER LAB ORDER: Potassium, External: 5.3 mmol/L — ABNORMAL HIGH (ref 3.5–5.1)

## 2022-07-27 LAB — BASIC METABOLIC PANEL
Anion Gap, External: 13 mmol/L — ABNORMAL HIGH (ref 4–11)
BUN: 43 mg/dL — ABNORMAL HIGH (ref 7.0–25.0)
CO2, External: 30 mmol/L (ref 21–31)
Calcium, External: 9.1 mg/dL (ref 8.6–10.3)
Chloride, External: 91 mmol/L — ABNORMAL LOW (ref 98–107)
Creatinine, External: 8.27 mg/dL — ABNORMAL HIGH (ref 0.70–1.30)
Glucose, External: 259 mg/dL — ABNORMAL HIGH (ref 70–109)
Potassium, External: 4.8 mmol/L (ref 3.5–5.1)
Sodium, External: 134 mmol/L — ABNORMAL LOW (ref 136–145)
eGFR, External: 7 mL/min/{1.73_m2} — ABNORMAL LOW (ref >60)

## 2023-03-15 LAB — COMPREHENSIVE METABOLIC PANEL
ALT (SGPT): 40 U/L (ref 7–52)
AST (SGOT), External: 33 U/L (ref 13–39)
Albumin, External: 4.3 g/dL (ref 3.5–5.7)
Alkaline Phosphatase, Phosphate: 357 U/L — ABNORMAL HIGH (ref 37–105)
Anion Gap, External: 13 mmol/L — ABNORMAL HIGH (ref 4–11)
BUN: 45 mg/dL — ABNORMAL HIGH (ref 7.0–25.0)
Bilirubin Total, External: 0.5 mg/dL (ref 0.3–1.2)
CO2, External: 29 mmol/L (ref 21–31)
Calcium, External: 8.5 mg/dL — ABNORMAL LOW (ref 8.6–10.3)
Chloride, External: 96 mmol/L — ABNORMAL LOW (ref 98–107)
Creatinine, External: 7.65 mg/dL — ABNORMAL HIGH (ref 0.70–1.30)
Glucose, External: 270 mg/dL — ABNORMAL HIGH (ref 70–109)
Potassium, External: 5.3 mmol/L — ABNORMAL HIGH (ref 3.5–5.1)
Sodium, External: 138 mmol/L (ref 136–145)
Total Protein, External: 7.9 g/dL (ref 6.0–8.3)
eGFR, External: 8 mL/min/{1.73_m2} — ABNORMAL LOW (ref >60)

## 2023-05-16 LAB — BASIC METABOLIC PANEL
Anion Gap, External: 9 mmol/L (ref 4–11)
BUN: 28 mg/dL — ABNORMAL HIGH (ref 7.0–25.0)
CO2, External: 31 mmol/L (ref 21–31)
Calcium, External: 9.7 mg/dL (ref 8.6–10.3)
Chloride, External: 96 mmol/L — ABNORMAL LOW (ref 98–107)
Creatinine, External: 5.99 mg/dL — ABNORMAL HIGH (ref 0.70–1.30)
Glucose, External: 196 mg/dL — ABNORMAL HIGH (ref 70–109)
Potassium, External: 4.6 mmol/L (ref 3.5–5.1)
Sodium, External: 136 mmol/L (ref 136–145)
eGFR, External: 11 mL/min/{1.73_m2} — ABNORMAL LOW (ref >60)

## 2023-06-03 LAB — OTHER LAB ORDER
Hemoglobin A1C: 8.5 % — ABNORMAL HIGH (ref 4.8–5.6)
Mean Blood Glucose Estimate: 197 mg/dL
